# Patient Record
Sex: Male | Born: 1995 | Race: Black or African American | Hispanic: No | Marital: Single | Smoking: Former smoker
Health system: Southern US, Community
[De-identification: ages and names within clinical notes are randomized; demographics above are authoritative.]

---

## 2019-03-04 ENCOUNTER — Encounter (HOSPITAL_BASED_OUTPATIENT_CLINIC_OR_DEPARTMENT_OTHER): Payer: Self-pay | Admitting: Emergency Medicine

## 2019-03-04 ENCOUNTER — Emergency Department (HOSPITAL_BASED_OUTPATIENT_CLINIC_OR_DEPARTMENT_OTHER): Payer: Self-pay

## 2019-03-04 ENCOUNTER — Other Ambulatory Visit: Payer: Self-pay

## 2019-03-04 ENCOUNTER — Emergency Department (HOSPITAL_BASED_OUTPATIENT_CLINIC_OR_DEPARTMENT_OTHER)
Admission: EM | Admit: 2019-03-04 | Discharge: 2019-03-04 | Disposition: A | Discharge status: Home / Self Care | Payer: Self-pay | Attending: Emergency Medicine | Admitting: Emergency Medicine

## 2019-03-04 DIAGNOSIS — R1030 Lower abdominal pain, unspecified: Secondary | ICD-10-CM

## 2019-03-04 DIAGNOSIS — Z87891 Personal history of nicotine dependence: Secondary | ICD-10-CM | POA: Insufficient documentation

## 2019-03-04 DIAGNOSIS — N390 Urinary tract infection, site not specified: Secondary | ICD-10-CM | POA: Insufficient documentation

## 2019-03-04 LAB — URINALYSIS, ROUTINE W REFLEX MICROSCOPIC
Glucose, UA: NEGATIVE mg/dL
Hgb urine dipstick: NEGATIVE
Ketones, ur: 15 mg/dL — AB
Nitrite: NEGATIVE
Protein, ur: NEGATIVE mg/dL
Specific Gravity, Urine: >1.03 — ABNORMAL HIGH (ref 1.005–1.030)
pH: 6 (ref 5.0–8.0)

## 2019-03-04 LAB — CBC WITH DIFFERENTIAL/PLATELET
Abs Immature Granulocytes: 0.02 10*3/uL (ref 0.00–0.07)
Basophils Absolute: 0.1 10*3/uL (ref 0.0–0.1)
Basophils Relative: 1 %
Eosinophils Absolute: 0 10*3/uL (ref 0.0–0.5)
Eosinophils Relative: 1 %
HCT: 47.8 % (ref 39.0–52.0)
Hemoglobin: 15 g/dL (ref 13.0–17.0)
Immature Granulocytes: 0 %
Lymphocytes Relative: 28 %
Lymphs Abs: 1.4 10*3/uL (ref 0.7–4.0)
MCH: 31.5 pg (ref 26.0–34.0)
MCHC: 31.4 g/dL (ref 30.0–36.0)
MCV: 100.4 fL — ABNORMAL HIGH (ref 80.0–100.0)
Monocytes Absolute: 0.4 10*3/uL (ref 0.1–1.0)
Monocytes Relative: 9 %
Neutro Abs: 3 10*3/uL (ref 1.7–7.7)
Neutrophils Relative %: 61 %
Platelets: 274 10*3/uL (ref 150–400)
RBC: 4.76 MIL/uL (ref 4.22–5.81)
RDW: 11.9 % (ref 11.5–15.5)
WBC: 4.9 10*3/uL (ref 4.0–10.5)
nRBC: 0 % (ref 0.0–0.2)

## 2019-03-04 LAB — COMPREHENSIVE METABOLIC PANEL
ALT: 10 U/L (ref 0–44)
AST: 15 U/L (ref 15–41)
Albumin: 4.8 g/dL (ref 3.5–5.0)
Alkaline Phosphatase: 42 U/L (ref 38–126)
Anion gap: 9 (ref 5–15)
BUN: 19 mg/dL (ref 6–20)
CO2: 26 mmol/L (ref 22–32)
Calcium: 9.2 mg/dL (ref 8.9–10.3)
Chloride: 104 mmol/L (ref 98–111)
Creatinine, Ser: 1.21 mg/dL (ref 0.61–1.24)
GFR calc Af Amer: >60 mL/min (ref >60)
GFR calc non Af Amer: >60 mL/min (ref >60)
Glucose, Bld: 95 mg/dL (ref 70–99)
Potassium: 3.5 mmol/L (ref 3.5–5.1)
Sodium: 139 mmol/L (ref 135–145)
Total Bilirubin: 1.8 mg/dL — ABNORMAL HIGH (ref 0.3–1.2)
Total Protein: 8.2 g/dL — ABNORMAL HIGH (ref 6.5–8.1)

## 2019-03-04 LAB — URINALYSIS, MICROSCOPIC (REFLEX): WBC, UA: >50 WBC/hpf (ref 0–5)

## 2019-03-04 LAB — LIPASE, BLOOD: Lipase: 23 U/L (ref 11–51)

## 2019-03-04 MED ORDER — ONDANSETRON HCL 4 MG/2ML IJ SOLN
4.0000 mg | Freq: Once | INTRAMUSCULAR | Status: AC
  Administered 2019-03-04: 09:00:00 4 mg via INTRAVENOUS
  Filled 2019-03-04: qty 2

## 2019-03-04 MED ORDER — IOHEXOL 300 MG/ML  SOLN
100.0000 mL | Freq: Once | INTRAMUSCULAR | Status: AC | PRN
  Administered 2019-03-04: 100 mL via INTRAVENOUS

## 2019-03-04 MED ORDER — SODIUM CHLORIDE 0.9 % IV BOLUS
1000.0000 mL | Freq: Once | INTRAVENOUS | Status: AC
  Administered 2019-03-04: 1000 mL via INTRAVENOUS

## 2019-03-04 MED ORDER — KETOROLAC TROMETHAMINE 30 MG/ML IJ SOLN
30.0000 mg | Freq: Once | INTRAMUSCULAR | Status: AC
  Administered 2019-03-04: 09:00:00 30 mg via INTRAVENOUS
  Filled 2019-03-04: qty 1

## 2019-03-04 MED ORDER — CEPHALEXIN 500 MG PO CAPS: 500.0000 mg | ORAL_CAPSULE | Freq: Four times a day (QID) | ORAL | 0 refills | Status: AC

## 2019-03-04 NOTE — ED Notes (Signed)
ED Provider at bedside. Unable to complete triage at this time. 

## 2019-03-04 NOTE — ED Notes (Signed)
Patient transported to CT 

## 2019-03-04 NOTE — ED Triage Notes (Signed)
LLQ abd pain x2 weeks. Vomiting about ever other day. Diarrhea daily.

## 2019-03-04 NOTE — Discharge Instructions (Addendum)
You were seen in the emergency department for some low abdominal pain.  You had blood work and a CAT scan along with a urinalysis.  The only significant finding was that you have signs of infection in your urine.  We are starting you on an antibiotic for this.  We also sent off STD testing that will take 1 to 2 days to result and we will contact you if any changes need to be made.  Please return to the emergency department if any worsening symptoms.

## 2019-03-04 NOTE — ED Provider Notes (Signed)
[facility] EMERGENCY DEPARTMENT Provider Note   CSN: [id number] Arrival date & time: 03/04/19  0844     History   Chief Complaint Chief Complaint  Patient presents with  . Abdominal Pain    HPI Reginald Curtis is a 23 y.o. male.  He has no significant past medical history.  He is complaining of left lower quadrant abdominal pain on and off for 2 weeks.  Is been associated with some intermittent nausea and vomiting and he said he has had 4 episodes of diarrhea daily.  No fevers or chills.  He rates the pain is 5 out of 10.  No prior history of same.  No recent travel or sick contacts.  No prior surgeries.  No urinary symptoms.     The history is provided by the patient.  Abdominal Pain Pain location:  LLQ Pain quality: cramping   Pain radiates to:  Does not radiate Pain severity:  Moderate Onset quality:  Gradual Duration:  2 weeks Timing:  Intermittent Progression:  Unchanged Chronicity:  New Context: not recent illness, not sick contacts, not suspicious food intake and not trauma   Relieved by:  Nothing Worsened by:  Nothing Ineffective treatments:  None tried Associated symptoms: diarrhea, nausea and vomiting   Associated symptoms: no chest pain, no constipation, no cough, no dysuria, no fever, no hematemesis, no hematochezia, no hematuria, no melena, no shortness of breath and no sore throat     History reviewed. No pertinent past medical history.  There are no active problems to display for this patient.   History reviewed. No pertinent surgical history.      Home Medications    Prior to Admission medications   Not on File    Family History No family history on file.  Social History Social History   Tobacco Use  . Smoking status: Former Games developermoker  . Smokeless tobacco: Never Used  Substance Use Topics  . Alcohol use: Yes    Comment: occ  . Drug use: Never     Allergies   Patient has no allergy information on record.   Review of Systems  Review of Systems  Constitutional: Negative for fever.  HENT: Negative for sore throat.   Eyes: Negative for visual disturbance.  Respiratory: Negative for cough and shortness of breath.   Cardiovascular: Negative for chest pain.  Gastrointestinal: Positive for abdominal pain, diarrhea, nausea and vomiting. Negative for constipation, hematemesis, hematochezia and melena.  Genitourinary: Negative for dysuria and hematuria.  Musculoskeletal: Negative for neck pain.  Skin: Negative for rash.  Neurological: Negative for headaches.     Physical Exam Updated Vital Signs BP (!) 138/100 (BP Location: Right Arm)   Pulse 61   Temp 98.3 F (36.8 C) (Oral)   Resp 16   Ht 5\' 10"  (1.778 m)   Wt 77.1 kg   SpO2 100%   BMI 24.39 kg/m   Physical Exam Vitals signs and nursing note reviewed.  Constitutional:      Appearance: He is well-developed.  HENT:     Head: Normocephalic and atraumatic.  Eyes:     Conjunctiva/sclera: Conjunctivae normal.  Neck:     Musculoskeletal: Neck supple.  Cardiovascular:     Rate and Rhythm: Normal rate and regular rhythm.     Heart sounds: No murmur.  Pulmonary:     Effort: Pulmonary effort is normal. No respiratory distress.     Breath sounds: Normal breath sounds.  Abdominal:     Palpations: Abdomen is soft.  Tenderness: There is abdominal tenderness in the left lower quadrant. There is no guarding or rebound.  Musculoskeletal: Normal range of motion.     Right lower leg: No edema.     Left lower leg: No edema.  Skin:    General: Skin is warm and dry.     Capillary Refill: Capillary refill takes less than 2 seconds.  Neurological:     General: No focal deficit present.     Mental Status: He is alert.     Gait: Gait normal.      ED Treatments / Results  Labs (all labs ordered are listed, but only abnormal results are displayed) Labs Reviewed  CBC WITH DIFFERENTIAL/PLATELET - Abnormal; Notable for the following components:      Result  Value   MCV 100.4 (*)    All other components within normal limits  COMPREHENSIVE METABOLIC PANEL - Abnormal; Notable for the following components:   Total Protein 8.2 (*)    Total Bilirubin 1.8 (*)    All other components within normal limits  URINALYSIS, ROUTINE W REFLEX MICROSCOPIC - Abnormal; Notable for the following components:   Color, Urine AMBER (*)    APPearance CLOUDY (*)    Specific Gravity, Urine >1.030 (*)    Bilirubin Urine SMALL (*)    Ketones, ur 15 (*)    Leukocytes,Ua SMALL (*)    All other components within normal limits  URINALYSIS, MICROSCOPIC (REFLEX) - Abnormal; Notable for the following components:   Bacteria, UA FEW (*)    All other components within normal limits  URINE CULTURE  LIPASE, BLOOD  GC/CHLAMYDIA PROBE AMP ([place]    EKG None  Radiology Ct Abdomen Pelvis W Contrast  Result Date: 03/04/2019 CLINICAL DATA:  Left lower quadrant pain, vomiting, diarrhea EXAM: CT ABDOMEN AND PELVIS WITH CONTRAST TECHNIQUE: Multidetector CT imaging of the abdomen and pelvis was performed using the standard protocol following bolus administration of intravenous contrast. CONTRAST:  100mL OMNIPAQUE IOHEXOL 300 MG/ML SOLN, additional oral enteric contrast COMPARISON:  None. FINDINGS: Lower chest: No acute abnormality. Hepatobiliary: No solid liver abnormality is seen. No gallstones, gallbladder wall thickening, or biliary dilatation. Pancreas: Unremarkable. No pancreatic ductal dilatation or surrounding inflammatory changes. Spleen: Normal in size without significant abnormality. Adrenals/Urinary Tract: Adrenal glands are unremarkable. Kidneys are normal, without renal calculi, solid lesion, or hydronephrosis. Bladder is unremarkable. Stomach/Bowel: Stomach is within normal limits. Appendix appears normal. No evidence of bowel wall thickening, distention, or inflammatory changes. Vascular/Lymphatic: No significant vascular findings are present. No enlarged  abdominal or pelvic lymph nodes. Reproductive: No mass or other significant abnormality. Other: No abdominal wall hernia or abnormality. No abdominopelvic ascites. Musculoskeletal: No acute or significant osseous findings. IMPRESSION: No CT findings of the abdomen or pelvis to explain left lower quadrant pain, vomiting, or diarrhea. No evidence of diverticulosis. Normal appendix. Electronically Signed   By: Lauralyn PrimesAlex  Bibbey M.D.   On: 03/04/2019 11:41    Procedures Procedures (including critical care time)  Medications Ordered in ED Medications  sodium chloride 0.9 % bolus 1,000 mL (has no administration in time range)  ketorolac (TORADOL) 30 MG/ML injection 30 mg (has no administration in time range)     Initial Impression / Assessment and Plan / ED Course  I have reviewed the triage vital signs and the nursing notes.  Pertinent labs & imaging results that were available during my care of the patient were reviewed by me and considered in my medical decision making (see  chart for details).  Clinical Course as of Mar 04 1037  Wed Mar 03, 5268  3115 23 year old healthy male here with lower abdominal pain for 2 weeks with on and off vomiting and daily diarrhea.  Differential includes diverticulitis, AGE, appendicitis, renal colic, UTI.   [MB]  7903 Patient CT did not show any acute findings.  Rest of his lab work was unremarkable but his urinalysis positive for greater than 50 whites.  Denies any urinary symptoms.  He denies any STD symptoms.  He does say sexually active though.  I offered that he should give [place] a urine sample to test for STD and then we will treat him for UTI and call him if there is any abnormalities on the STD testing.  He is agreeable to this plan.   [MB]    Clinical Course User Index [MB] Hayden Rasmussen, MD      Final Clinical Impressions(s) / ED Diagnoses   Final diagnoses:  Lower urinary tract infectious disease  Lower abdominal pain    ED Discharge Orders          Ordered    cephALEXin (KEFLEX) 500 MG capsule  4 times daily     03/04/19 1244           Hayden Rasmussen, MD 03/05/19 1038

## 2019-03-05 LAB — URINE CULTURE: Culture: 20000 — AB

## 2019-03-05 LAB — GC/CHLAMYDIA PROBE AMP (~~LOC~~) NOT AT ARMC
Chlamydia: POSITIVE — AB
Neisseria Gonorrhea: NEGATIVE

## 2019-03-06 ENCOUNTER — Telehealth: Payer: Self-pay | Admitting: Emergency Medicine

## 2019-03-06 NOTE — Telephone Encounter (Signed)
Post ED Visit - Positive Culture Follow-up  Culture report reviewed by antimicrobial stewardship pharmacist: [facility] []  Elenor Quinones, Pharm.D. []  Heide Guile, Pharm.D., BCPS AQ-ID []  Parks Neptune, Pharm.D., BCPS []  Alycia Rossetti, Pharm.D., BCPS []  [place], Pharm.D., BCPS, AAHIVP []  Legrand Como, Pharm.D., BCPS, AAHIVP [x]  Salome Arnt, PharmD, BCPS []  Johnnette Gourd, PharmD, BCPS []  Hughes Better, PharmD, BCPS []  Leeroy Cha, PharmD []  Laqueta Linden, PharmD, BCPS []  Albertina Parr, PharmD  [facility] Team []  Leodis Sias, PharmD []  Lindell Spar, PharmD []  Royetta Asal, PharmD []  Graylin Shiver, Rph []  Rema Fendt) Glennon Mac, PharmD []  Arlyn Dunning, PharmD []  Netta Cedars, PharmD []  Dia Sitter, PharmD []  Leone Haven, PharmD []  Gretta Arab, PharmD []  Theodis Shove, PharmD []  Peggyann Juba, PharmD []  Reuel Boom, PharmD   Positive urine culture Treated with Cephalexin, organism sensitive to the same and no further patient follow-up is required at this time.  Sandi Raveling Abishai Viegas 03/06/2019, 10:01 AM

## 2020-04-29 IMAGING — CT CT ABDOMEN AND PELVIS WITH CONTRAST
2 of 4 series · 16 of 46 positions shown, 18 images · IV contrast (omnipaque)
Comparison: None.

CLINICAL DATA: Left lower quadrant pain, vomiting, diarrhea

EXAM:
CT ABDOMEN AND PELVIS WITH CONTRAST
TECHNIQUE: Multidetector CT imaging of the abdomen and pelvis was performed
using the standard protocol following bolus administration of
intravenous contrast.
CONTRAST:  100mL OMNIPAQUE IOHEXOL 300 MG/ML SOLN, additional oral
enteric contrast

[Series 2: axial st · axial · 0.82mm/px · z∈[-544,-70]mm · 13 of 103 slices shown, 15 images]
[im 4/103  soft-tissue]
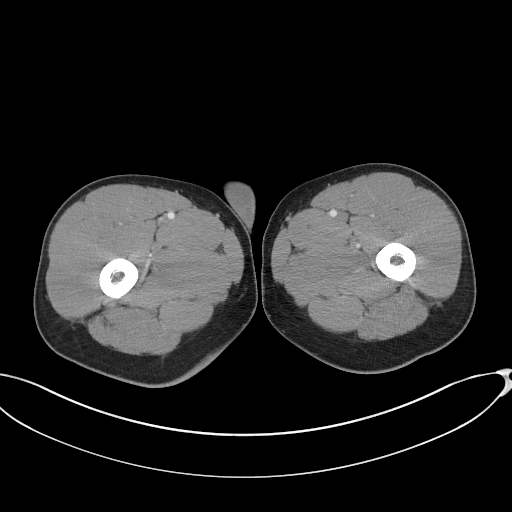
[im 4/103  bone]
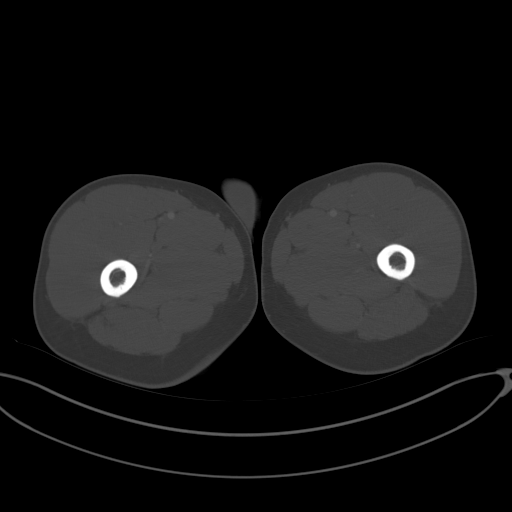
[im 12/103  soft-tissue]
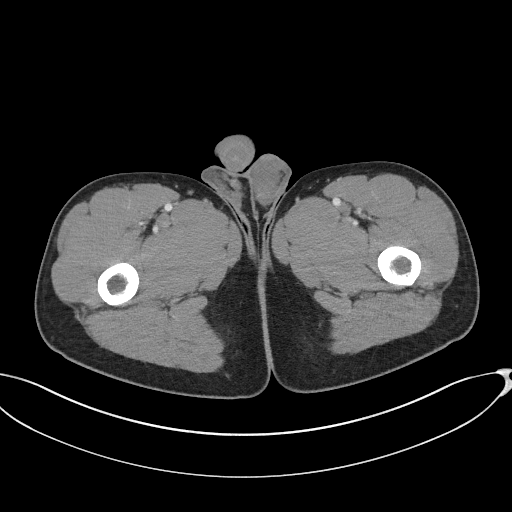
[im 20/103  soft-tissue]
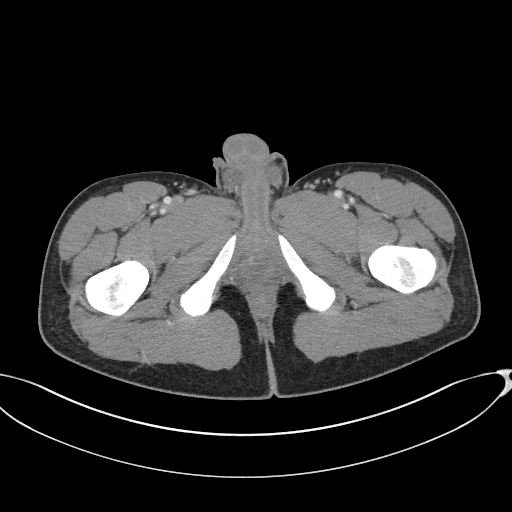
[im 28/103  soft-tissue]
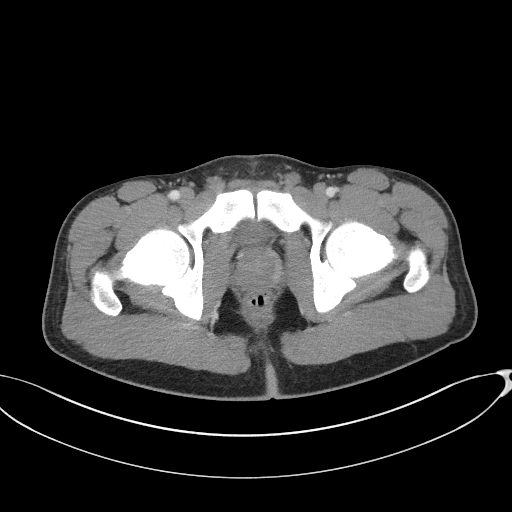
[im 36/103  soft-tissue]
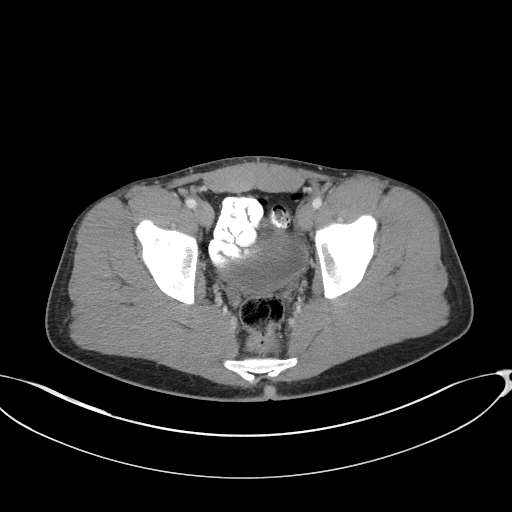
[im 44/103  soft-tissue]
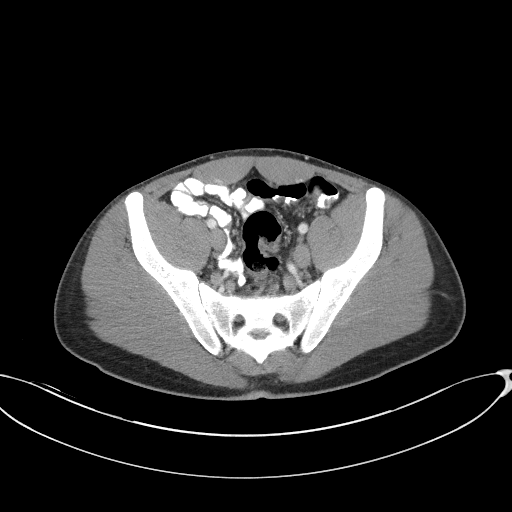
[im 52/103  soft-tissue]
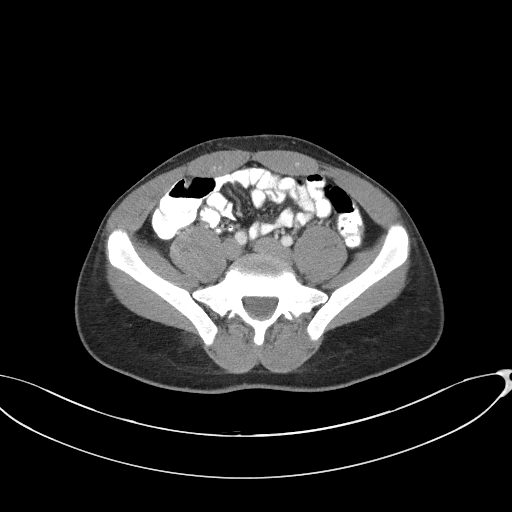
[im 59/103  soft-tissue]
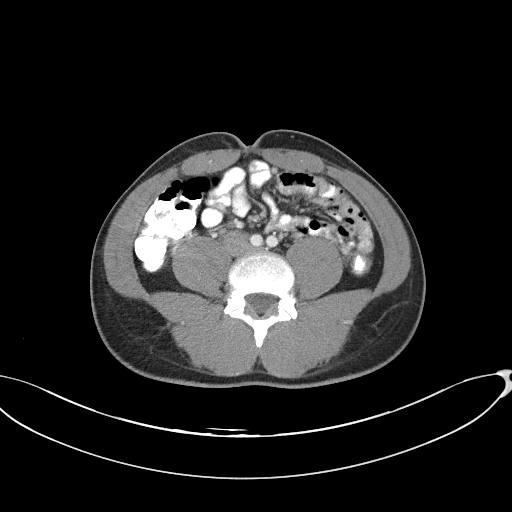
[im 67/103  soft-tissue]
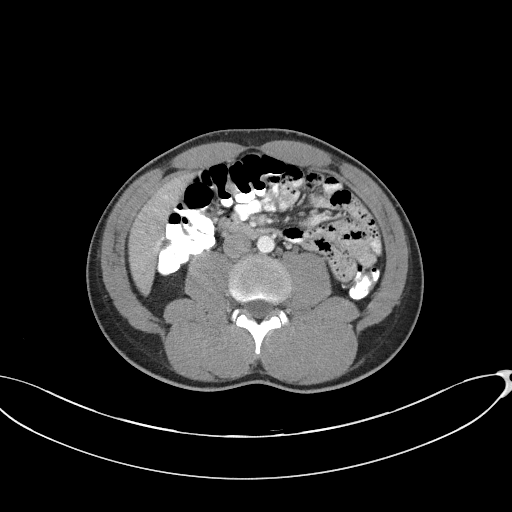
[im 67/103  bone]
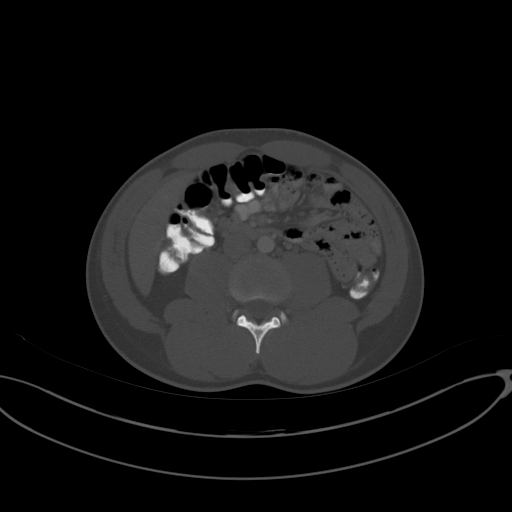
[im 75/103  soft-tissue]
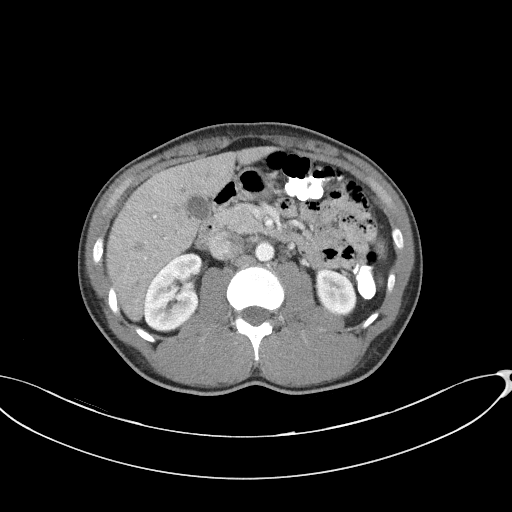
[im 83/103  soft-tissue]
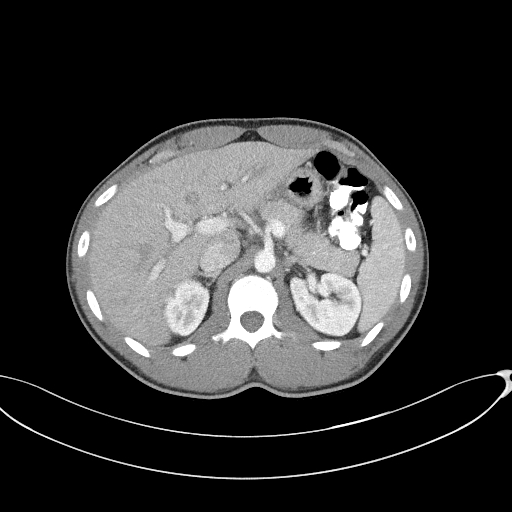
[im 91/103  soft-tissue]
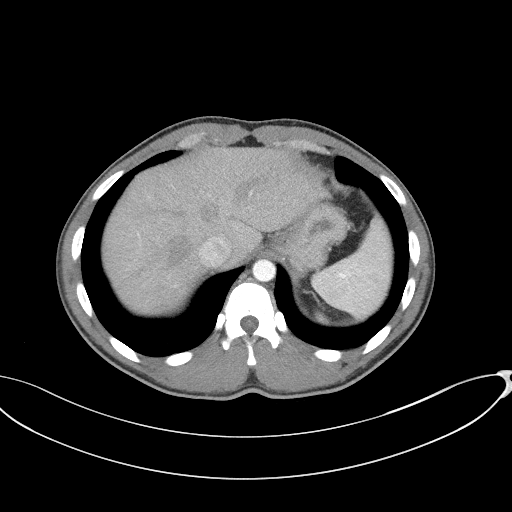
[im 99/103  soft-tissue]
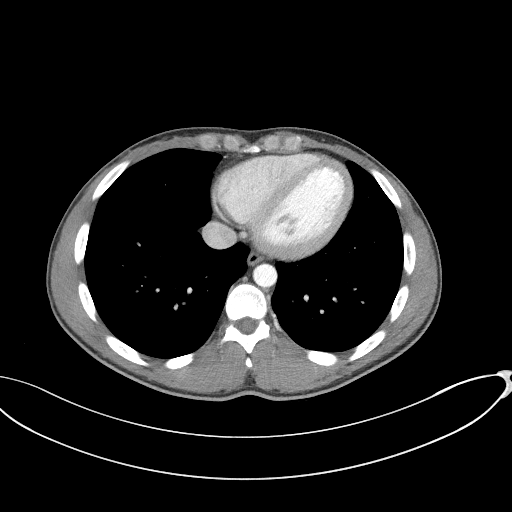

[Series 5: coronal st · coronal · 0.74mm/px · 3 of 80 slices shown]
[im 27/80  soft-tissue]
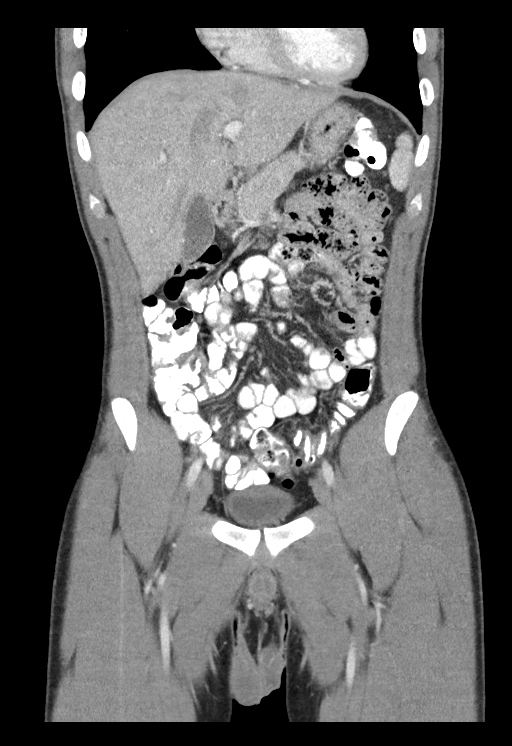
[im 36/80  soft-tissue]
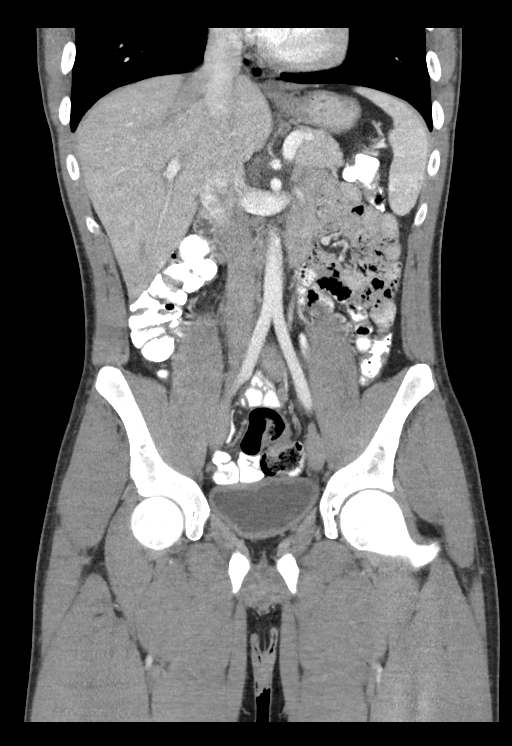
[im 44/80  soft-tissue]
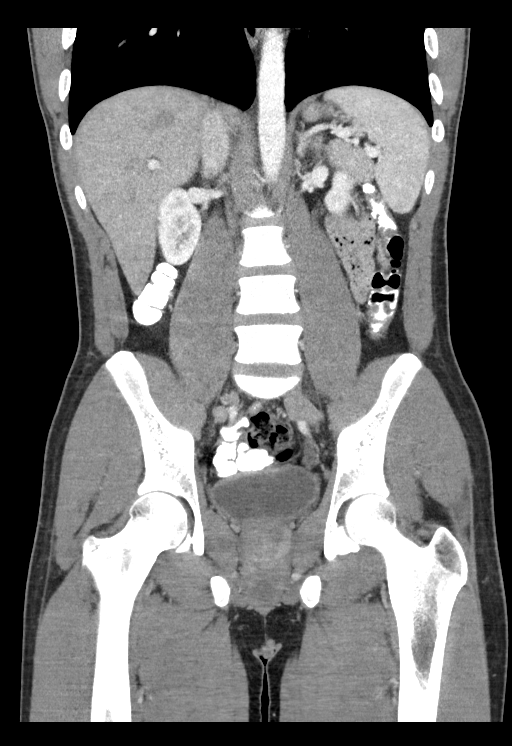

[16 of 46 positions shown; findings below may reference images not displayed]

FINDINGS: Lower chest: No acute abnormality.

Hepatobiliary: No solid liver abnormality is seen. No gallstones,
gallbladder wall thickening, or biliary dilatation.

Pancreas: Unremarkable. No pancreatic ductal dilatation or
surrounding inflammatory changes.

Spleen: Normal in size without significant abnormality.

Adrenals/Urinary Tract: Adrenal glands are unremarkable. Kidneys are
normal, without renal calculi, solid lesion, or hydronephrosis.
Bladder is unremarkable.

Stomach/Bowel: Stomach is within normal limits. Appendix appears
normal. No evidence of bowel wall thickening, distention, or
inflammatory changes.

Vascular/Lymphatic: No significant vascular findings are present. No
enlarged abdominal or pelvic lymph nodes.

Reproductive: No mass or other significant abnormality.

Other: No abdominal wall hernia or abnormality. No abdominopelvic
ascites.

Musculoskeletal: No acute or significant osseous findings.
IMPRESSION: No CT findings of the abdomen or pelvis to explain left lower
quadrant pain, vomiting, or diarrhea. No evidence of diverticulosis.
Normal appendix.
